# Patient Record
Sex: Female | Born: 1950 | Race: Black or African American | Hispanic: No | Marital: Married | Smoking: Never smoker
Health system: Southern US, Community
[De-identification: ages and names within clinical notes are randomized; demographics above are authoritative.]

## PROBLEM LIST (undated history)

## (undated) DIAGNOSIS — I639 Cerebral infarction, unspecified: Secondary | ICD-10-CM

## (undated) HISTORY — DX: Cerebral infarction, unspecified: I63.9

---

## 1998-07-16 ENCOUNTER — Inpatient Hospital Stay (HOSPITAL_COMMUNITY): Admission: RE | Admit: 1998-07-16 | Discharge: 1998-07-19 | Payer: Self-pay | Admitting: Obstetrics and Gynecology

## 2000-11-21 ENCOUNTER — Emergency Department (HOSPITAL_COMMUNITY): Admission: EM | Admit: 2000-11-21 | Discharge: 2000-11-21 | Payer: Self-pay | Admitting: Emergency Medicine

## 2003-03-21 ENCOUNTER — Other Ambulatory Visit: Admission: RE | Admit: 2003-03-21 | Discharge: 2003-03-21 | Payer: Self-pay | Admitting: Family Medicine

## 2007-08-03 ENCOUNTER — Emergency Department (HOSPITAL_COMMUNITY): Admission: EM | Admit: 2007-08-03 | Discharge: 2007-08-03 | Payer: Self-pay | Admitting: Emergency Medicine

## 2014-12-27 ENCOUNTER — Emergency Department (HOSPITAL_COMMUNITY): Payer: BLUE CROSS/BLUE SHIELD

## 2014-12-27 ENCOUNTER — Encounter (HOSPITAL_COMMUNITY): Payer: Self-pay | Admitting: Emergency Medicine

## 2014-12-27 ENCOUNTER — Emergency Department (HOSPITAL_COMMUNITY)
Admission: EM | Admit: 2014-12-27 | Discharge: 2014-12-27 | Disposition: A | Discharge status: Home / Self Care | Payer: BLUE CROSS/BLUE SHIELD | Attending: Emergency Medicine | Admitting: Emergency Medicine

## 2014-12-27 DIAGNOSIS — Y9389 Activity, other specified: Secondary | ICD-10-CM | POA: Insufficient documentation

## 2014-12-27 DIAGNOSIS — S20309A Unspecified superficial injuries of unspecified front wall of thorax, initial encounter: Secondary | ICD-10-CM | POA: Diagnosis not present

## 2014-12-27 DIAGNOSIS — S199XXA Unspecified injury of neck, initial encounter: Secondary | ICD-10-CM | POA: Diagnosis not present

## 2014-12-27 DIAGNOSIS — Y998 Other external cause status: Secondary | ICD-10-CM | POA: Insufficient documentation

## 2014-12-27 DIAGNOSIS — Y92410 Unspecified street and highway as the place of occurrence of the external cause: Secondary | ICD-10-CM | POA: Insufficient documentation

## 2014-12-27 DIAGNOSIS — S0990XA Unspecified injury of head, initial encounter: Secondary | ICD-10-CM | POA: Diagnosis present

## 2014-12-27 DIAGNOSIS — G4489 Other headache syndrome: Secondary | ICD-10-CM | POA: Insufficient documentation

## 2014-12-27 DIAGNOSIS — R0789 Other chest pain: Secondary | ICD-10-CM

## 2014-12-27 MED ORDER — CYCLOBENZAPRINE HCL 10 MG PO TABS: 10.0000 mg | ORAL_TABLET | Freq: Two times a day (BID) | ORAL | Status: AC | PRN

## 2014-12-27 MED ORDER — IBUPROFEN 600 MG PO TABS: 600.0000 mg | ORAL_TABLET | Freq: Four times a day (QID) | ORAL | Status: AC | PRN

## 2014-12-27 MED ORDER — ACETAMINOPHEN 325 MG PO TABS
650.0000 mg | ORAL_TABLET | Freq: Once | ORAL | Status: AC
  Administered 2014-12-27: 650 mg via ORAL
  Filled 2014-12-27: qty 2
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
## 2014-12-27 NOTE — Discharge Instructions (Signed)
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
## 2015-01-17 ENCOUNTER — Ambulatory Visit
Admission: RE | Admit: 2015-01-17 | Discharge: 2015-01-17 | Disposition: A | Discharge status: Home / Self Care | Payer: BLUE CROSS/BLUE SHIELD | Source: Ambulatory Visit | Attending: Family Medicine | Admitting: Family Medicine

## 2015-01-17 ENCOUNTER — Ambulatory Visit (INDEPENDENT_AMBULATORY_CARE_PROVIDER_SITE_OTHER): Payer: BLUE CROSS/BLUE SHIELD | Admitting: Family Medicine

## 2015-01-17 VITALS — BP 122/86 | HR 83 | Temp 98.6°F | Resp 18 | Ht 63.25 in | Wt 221.2 lb

## 2015-01-17 DIAGNOSIS — K625 Hemorrhage of anus and rectum: Secondary | ICD-10-CM

## 2015-01-17 DIAGNOSIS — R1030 Lower abdominal pain, unspecified: Secondary | ICD-10-CM

## 2015-01-17 DIAGNOSIS — R319 Hematuria, unspecified: Secondary | ICD-10-CM

## 2015-01-17 DIAGNOSIS — R1031 Right lower quadrant pain: Secondary | ICD-10-CM

## 2015-01-17 DIAGNOSIS — R195 Other fecal abnormalities: Secondary | ICD-10-CM

## 2015-01-17 LAB — POCT CBC
GRANULOCYTE PERCENT: 63 % (ref 37–80)
HCT, POC: 44.5 % (ref 37.7–47.9)
HEMOGLOBIN: 13.8 g/dL (ref 12.2–16.2)
LYMPH, POC: 2.6 (ref 0.6–3.4)
MCH: 24.9 pg — AB (ref 27–31.2)
MCHC: 31.1 g/dL — AB (ref 31.8–35.4)
MCV: 80.3 fL (ref 80–97)
MID (CBC): 0.2 (ref 0–0.9)
MPV: 9.2 fL (ref 0–99.8)
POC GRANULOCYTE: 4.7 (ref 2–6.9)
POC LYMPH %: 34.9 % (ref 10–50)
POC MID %: 2.1 % (ref 0–12)
Platelet Count, POC: 212 10*3/uL (ref 142–424)
RBC: 5.55 M/uL — AB (ref 4.04–5.48)
RDW, POC: 16.5 %
WBC: 7.4 10*3/uL (ref 4.6–10.2)

## 2015-01-17 LAB — POCT URINALYSIS DIPSTICK
Bilirubin, UA: NEGATIVE
GLUCOSE UA: NEGATIVE
Ketones, UA: NEGATIVE
Leukocytes, UA: NEGATIVE
NITRITE UA: NEGATIVE
PROTEIN UA: NEGATIVE
Spec Grav, UA: 1.015
Urobilinogen, UA: 0.2
pH, UA: 6.5

## 2015-01-17 LAB — POCT UA - MICROSCOPIC ONLY
Bacteria, U Microscopic: NEGATIVE
CRYSTALS, UR, HPF, POC: NEGATIVE
Casts, Ur, LPF, POC: NEGATIVE
MUCUS UA: NEGATIVE
WBC, UR, HPF, POC: NEGATIVE
Yeast, UA: NEGATIVE
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
## 2015-01-17 NOTE — Patient Instructions (Addendum)
Your [facility] today at 5:40 pm. Their address is [address]. We will call you with your scan results.  Your urine test showed a few blood cells, but this may not be significant. Blood test looked ok, but as area of pain in groin may be in area where hernia presents and pain worsening - will check CT scan today to look at this area further. If cat scan normal, this may be a contusion or bruising.  Tylenol as needed for now.  We will check for blood in stool - avoid nsaids for now, and avoid aspirin for now.  If this test is positive - we can refer you to stomach specialist as discussed.  If pain in back not improving in next 2 weeks - return to discuss further.   Return to the clinic or go to the nearest emergency room if any of your symptoms worsen or new symptoms occur.  Abdominal Pain Many things can cause abdominal pain. Usually, abdominal pain is not caused by a disease and will improve without treatment. It can often be observed and treated at home. Your health care provider will do a physical exam and possibly order blood tests and X-rays to help determine the seriousness of your pain. However, in many cases, more time must pass before a clear cause of the pain can be found. Before that point, your health care provider may not know if you need more testing or further treatment. HOME CARE INSTRUCTIONS  Monitor your abdominal pain for any changes. The following actions may help to alleviate any discomfort you are experiencing:  Only take over-the-counter or prescription medicines as directed by your health care provider.  Do not take laxatives unless directed to do so by your health care provider.  Try a clear liquid diet (broth, tea, or water) as directed by your health care provider. Slowly move to a bland diet as tolerated. SEEK MEDICAL CARE IF:  You have unexplained abdominal pain.  You have abdominal pain associated with nausea or  diarrhea.  You have pain when you urinate or have a bowel movement.  You experience abdominal pain that wakes you in the night.  You have abdominal pain that is worsened or improved by eating food.  You have abdominal pain that is worsened with eating fatty foods.  You have a fever. SEEK IMMEDIATE MEDICAL CARE IF:   Your pain does not go away within 2 hours.  You keep throwing up (vomiting).  Your pain is felt only in portions of the abdomen, such as the right side or the left lower portion of the abdomen.  You pass bloody or black tarry stools. MAKE SURE YOU:  Understand these instructions.   Will watch your condition.   Will get help right away if you are not doing well or get worse.  Document Released: 09/16/2005 Document Revised: 12/12/2013 Document Reviewed: 08/16/2013 [facility] Patient Information 2015 [place]. This information is not intended to replace advice given to you by your health care provider. Make sure you discuss any questions you have with your health care provider.

## 2015-01-19 LAB — POC HEMOCCULT BLD/STL (OFFICE/1-CARD/DIAGNOSTIC): FECAL OCCULT BLD: POSITIVE

## 2015-02-06 NOTE — Addendum Note (Signed)
Addended by: Meredith StaggersGREENE, Rosy Estabrook R on: 02/06/2015 04:06 PM   Modules accepted: Orders

## 2015-02-12 ENCOUNTER — Encounter: Payer: Self-pay | Admitting: Family Medicine

## 2015-02-14 ENCOUNTER — Other Ambulatory Visit: Payer: Self-pay | Admitting: Family Medicine

## 2015-02-14 DIAGNOSIS — R1031 Right lower quadrant pain: Secondary | ICD-10-CM

## 2015-02-14 DIAGNOSIS — G8929 Other chronic pain: Secondary | ICD-10-CM

## 2015-02-14 DIAGNOSIS — R195 Other fecal abnormalities: Secondary | ICD-10-CM
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
